# Patient Record
Sex: Female | Born: 1950 | Race: Black or African American | Hispanic: No | Marital: Single | Smoking: Current every day smoker
Health system: Southern US, Community
[De-identification: ages and names within clinical notes are randomized; demographics above are authoritative.]

## PROBLEM LIST (undated history)

## (undated) DIAGNOSIS — J449 Chronic obstructive pulmonary disease, unspecified: Secondary | ICD-10-CM

## (undated) DIAGNOSIS — I509 Heart failure, unspecified: Secondary | ICD-10-CM

## (undated) DIAGNOSIS — I10 Essential (primary) hypertension: Secondary | ICD-10-CM

## (undated) DIAGNOSIS — G8929 Other chronic pain: Secondary | ICD-10-CM

## (undated) DIAGNOSIS — K649 Unspecified hemorrhoids: Secondary | ICD-10-CM

## (undated) DIAGNOSIS — M549 Dorsalgia, unspecified: Secondary | ICD-10-CM

---

## 2006-02-02 ENCOUNTER — Emergency Department (HOSPITAL_COMMUNITY): Admission: EM | Admit: 2006-02-02 | Discharge: 2006-02-03 | Payer: Self-pay | Admitting: Emergency Medicine

## 2006-02-22 ENCOUNTER — Emergency Department (HOSPITAL_COMMUNITY): Admission: EM | Admit: 2006-02-22 | Discharge: 2006-02-22 | Payer: Self-pay | Admitting: Emergency Medicine

## 2006-04-25 ENCOUNTER — Emergency Department (HOSPITAL_COMMUNITY): Admission: EM | Admit: 2006-04-25 | Discharge: 2006-04-25 | Payer: Self-pay | Admitting: Emergency Medicine

## 2009-07-23 ENCOUNTER — Encounter: Admission: RE | Admit: 2009-07-23 | Discharge: 2009-07-23 | Payer: Self-pay | Admitting: Neurological Surgery

## 2009-08-18 ENCOUNTER — Inpatient Hospital Stay (HOSPITAL_COMMUNITY): Admission: RE | Admit: 2009-08-18 | Discharge: 2009-08-21 | Payer: Self-pay | Admitting: Neurological Surgery

## 2009-09-21 ENCOUNTER — Encounter: Admission: RE | Admit: 2009-09-21 | Discharge: 2009-09-21 | Payer: Self-pay | Admitting: Neurological Surgery

## 2009-11-22 ENCOUNTER — Encounter: Admission: RE | Admit: 2009-11-22 | Discharge: 2009-11-22 | Payer: Self-pay | Admitting: Neurological Surgery

## 2009-12-21 ENCOUNTER — Encounter: Admission: RE | Admit: 2009-12-21 | Discharge: 2009-12-21 | Payer: Self-pay | Admitting: Neurological Surgery

## 2010-02-06 ENCOUNTER — Encounter: Payer: Self-pay | Admitting: Neurological Surgery

## 2010-02-21 ENCOUNTER — Other Ambulatory Visit: Payer: Self-pay | Admitting: Neurological Surgery

## 2010-02-21 ENCOUNTER — Ambulatory Visit
Admission: RE | Admit: 2010-02-21 | Discharge: 2010-02-21 | Disposition: A | Discharge status: Home / Self Care | Payer: Medicare Other | Source: Ambulatory Visit | Attending: Neurological Surgery | Admitting: Neurological Surgery

## 2010-02-21 DIAGNOSIS — M545 Low back pain: Secondary | ICD-10-CM

## 2010-04-01 LAB — CARDIAC PANEL(CRET KIN+CKTOT+MB+TROPI): Relative Index: 0.9 (ref 0.0–2.5)

## 2010-04-02 LAB — SURGICAL PCR SCREEN
MRSA, PCR: NEGATIVE
Staphylococcus aureus: NEGATIVE

## 2010-04-02 LAB — DIFFERENTIAL
Basophils Relative: 0 % (ref 0–1)
Eosinophils Absolute: 0.1 10*3/uL (ref 0.0–0.7)
Monocytes Relative: 5 % (ref 3–12)
Neutrophils Relative %: 85 % — ABNORMAL HIGH (ref 43–77)

## 2010-04-02 LAB — CBC
HCT: 38.2 % (ref 36.0–46.0)
Hemoglobin: 12.6 g/dL (ref 12.0–15.0)
WBC: 10 10*3/uL (ref 4.0–10.5)

## 2010-04-02 LAB — URINALYSIS, ROUTINE W REFLEX MICROSCOPIC
Bilirubin Urine: NEGATIVE
Hgb urine dipstick: NEGATIVE
Specific Gravity, Urine: 1.013 (ref 1.005–1.030)
Urobilinogen, UA: 0.2 mg/dL (ref 0.0–1.0)

## 2010-04-02 LAB — COMPREHENSIVE METABOLIC PANEL
ALT: 15 U/L (ref 0–35)
Alkaline Phosphatase: 94 U/L (ref 39–117)
CO2: 30 mEq/L (ref 19–32)
Chloride: 106 mEq/L (ref 96–112)
GFR calc non Af Amer: 45 mL/min — ABNORMAL LOW (ref >60)
Glucose, Bld: 129 mg/dL — ABNORMAL HIGH (ref 70–99)
Potassium: 3.5 mEq/L (ref 3.5–5.1)
Sodium: 143 mEq/L (ref 135–145)

## 2010-04-02 LAB — PROTIME-INR
INR: 1.01 (ref 0.00–1.49)
Prothrombin Time: 13.2 seconds (ref 11.6–15.2)

## 2010-04-02 LAB — TYPE AND SCREEN: Antibody Screen: NEGATIVE

## 2010-05-31 ENCOUNTER — Other Ambulatory Visit: Payer: Self-pay | Admitting: Neurological Surgery

## 2010-05-31 ENCOUNTER — Ambulatory Visit
Admission: RE | Admit: 2010-05-31 | Discharge: 2010-05-31 | Disposition: A | Discharge status: Home / Self Care | Payer: Medicare Other | Source: Ambulatory Visit | Attending: Neurological Surgery | Admitting: Neurological Surgery

## 2010-05-31 DIAGNOSIS — M549 Dorsalgia, unspecified: Secondary | ICD-10-CM

## 2012-03-19 IMAGING — CR DG CHEST 2V
2 series · 2 of 2 positions shown · non-contrast
Comparison: 08/13/2009.

CLINICAL DATA: Stenosis.  Evaluate for infiltrates.

CHEST - 2 VIEW

[w chest pa]
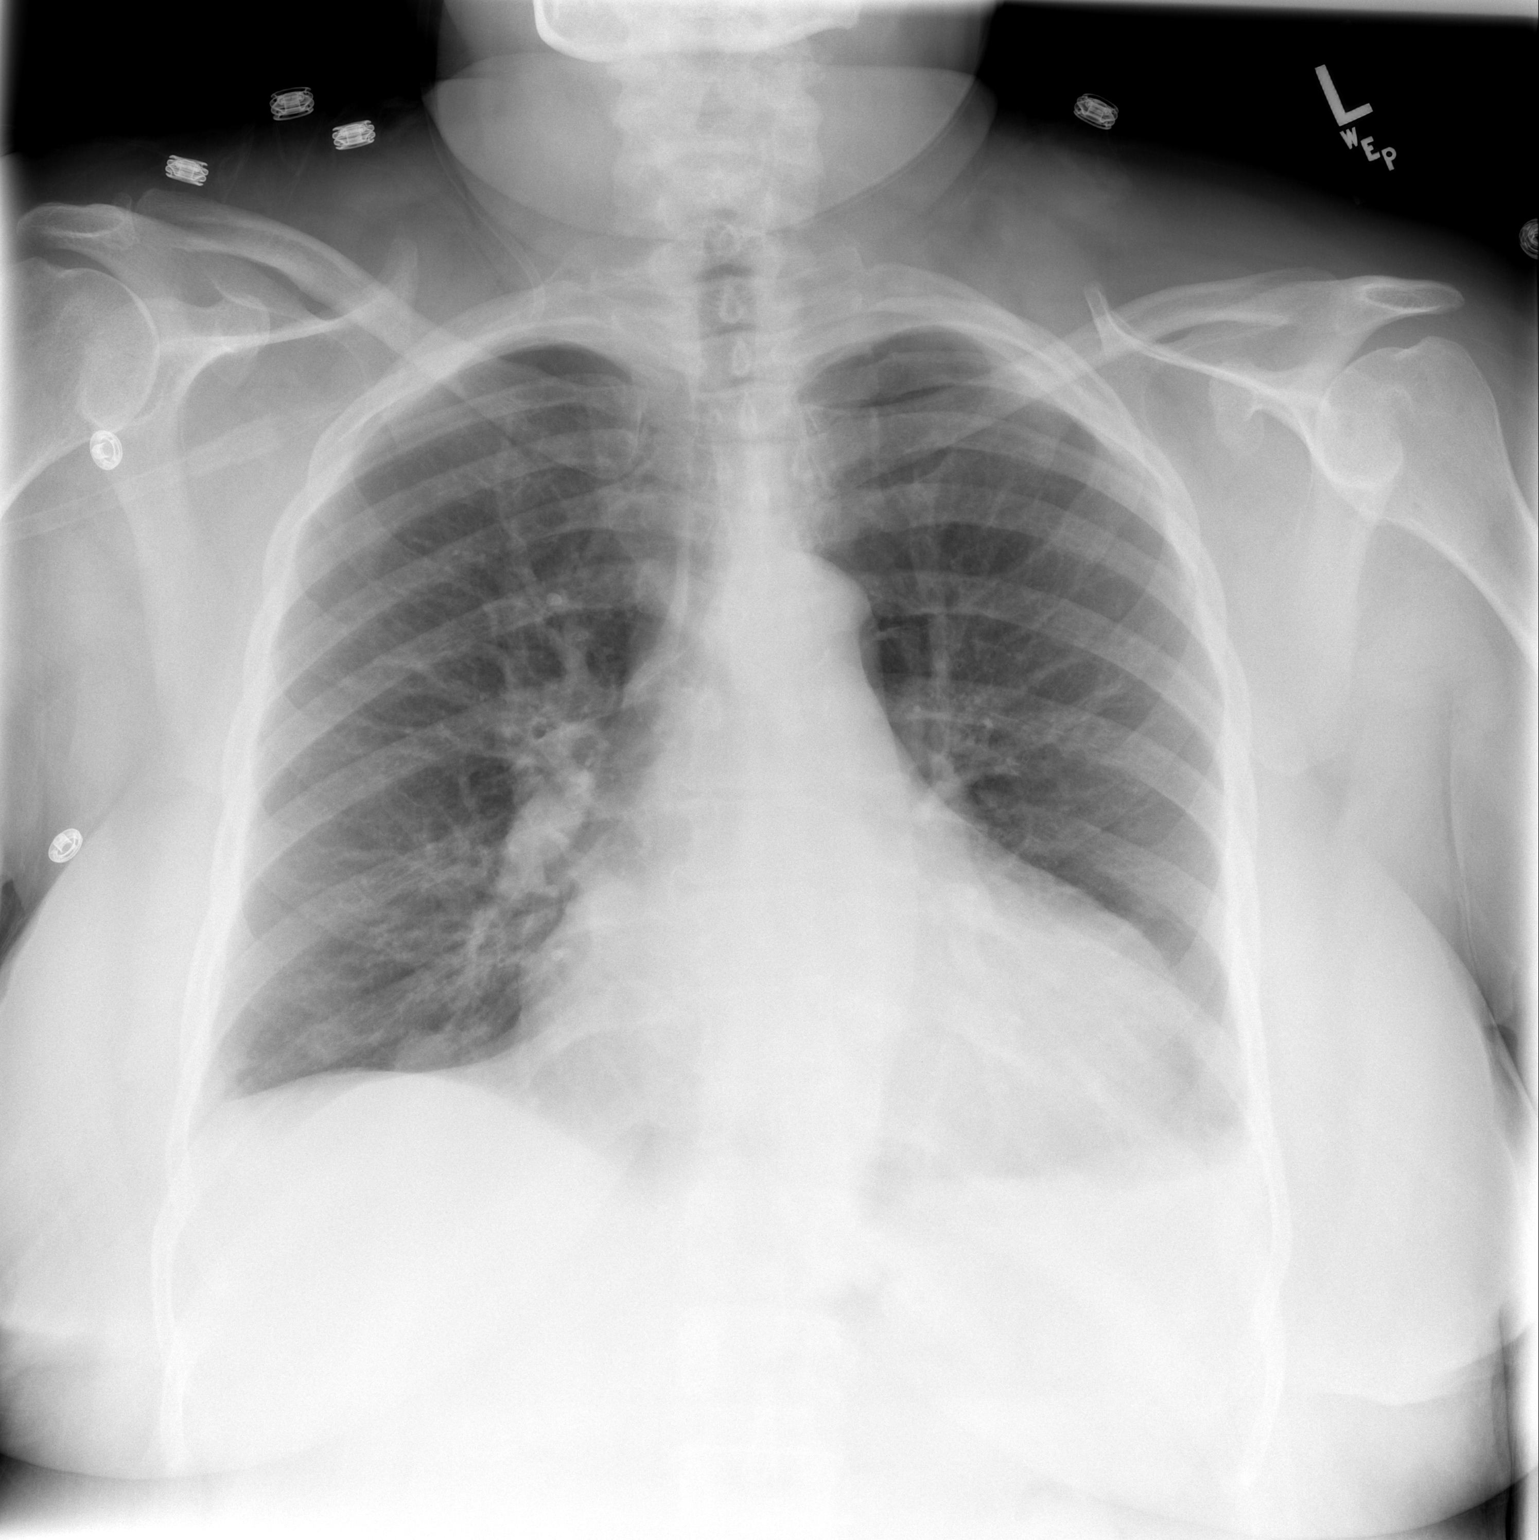

[w chest lat]
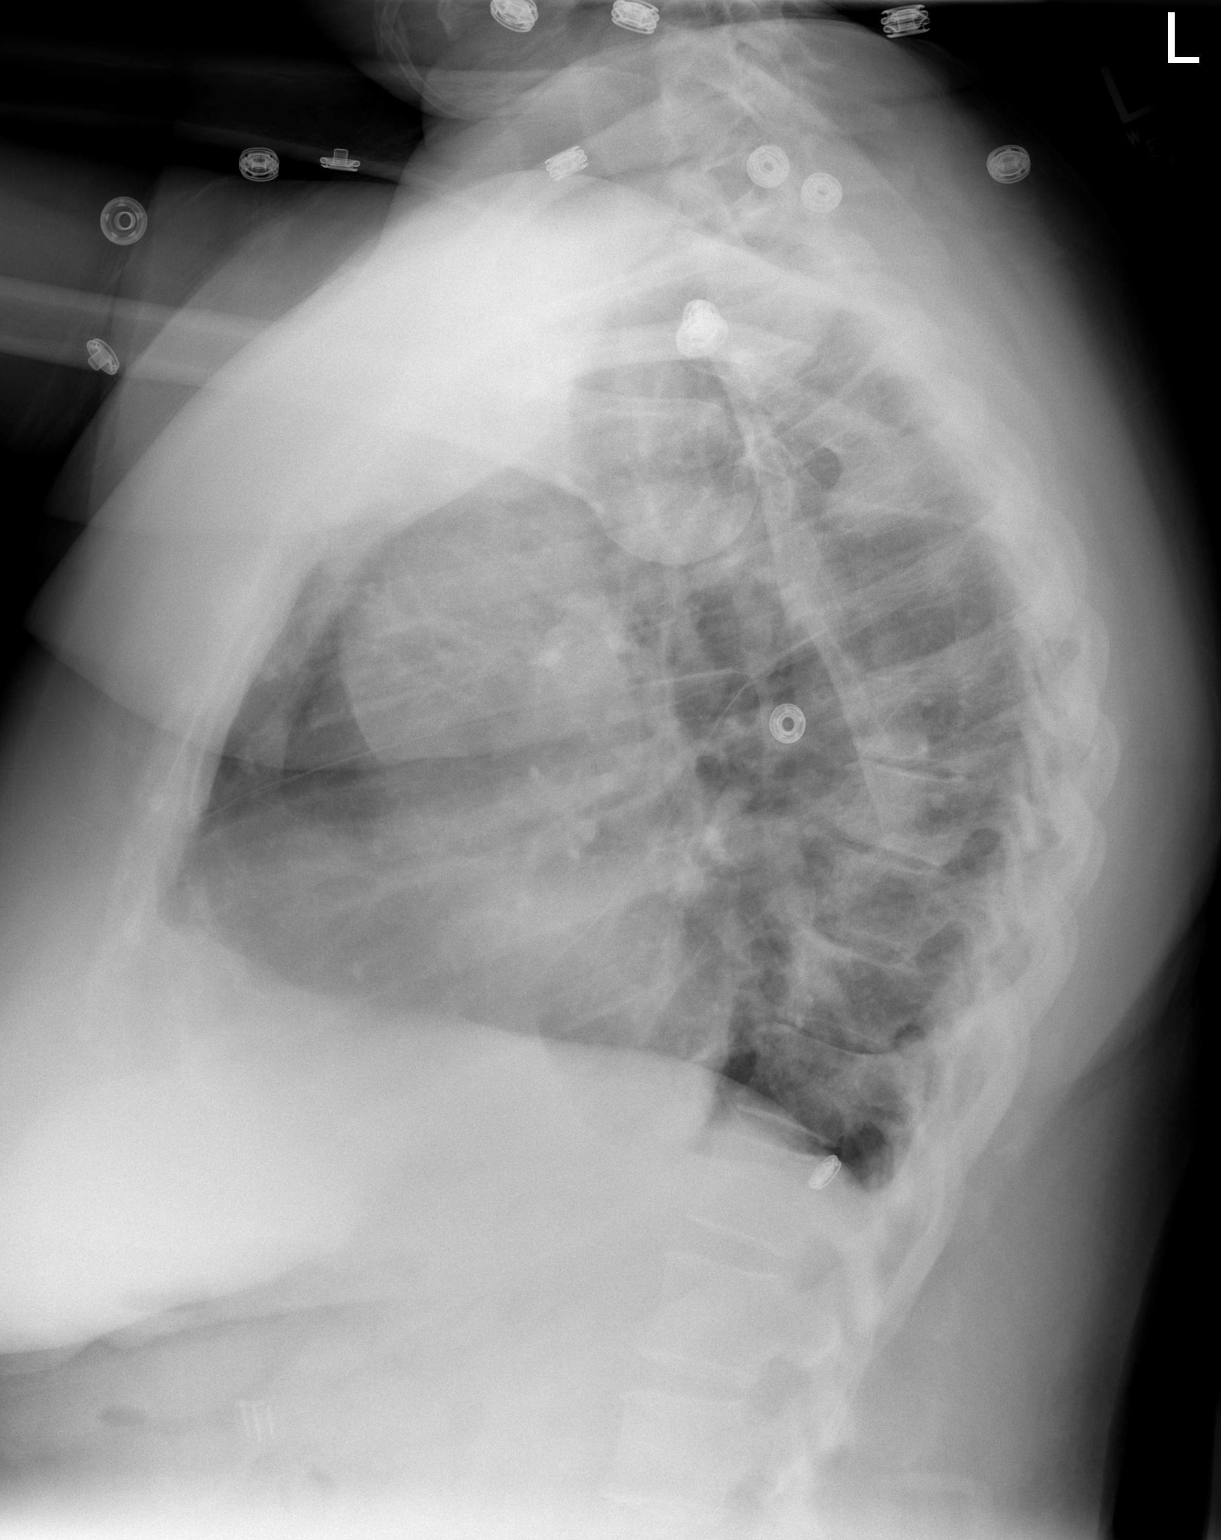

[2 of 2 positions shown; findings below may reference images not displayed]

FINDINGS: Cardiomegaly.  Mild pulmonary vascular congestion.  Small
left pleural effusion.  Mediastinal contours unchanged.
IMPRESSION: Cardiomegaly, pulmonary vascular congestion and small left pleural
effusions suggest mild CHF.

## 2012-06-21 IMAGING — CR DG LUMBAR SPINE 2-3V
3 series · 3 of 3 positions shown · non-contrast
Comparison: The lumbar spine films of 09/21/2009

CLINICAL DATA: Lumbar spine surgery in August 2009, follow-up

LUMBAR SPINE - 2-3 VIEW

[view not recorded (1 of 3)]
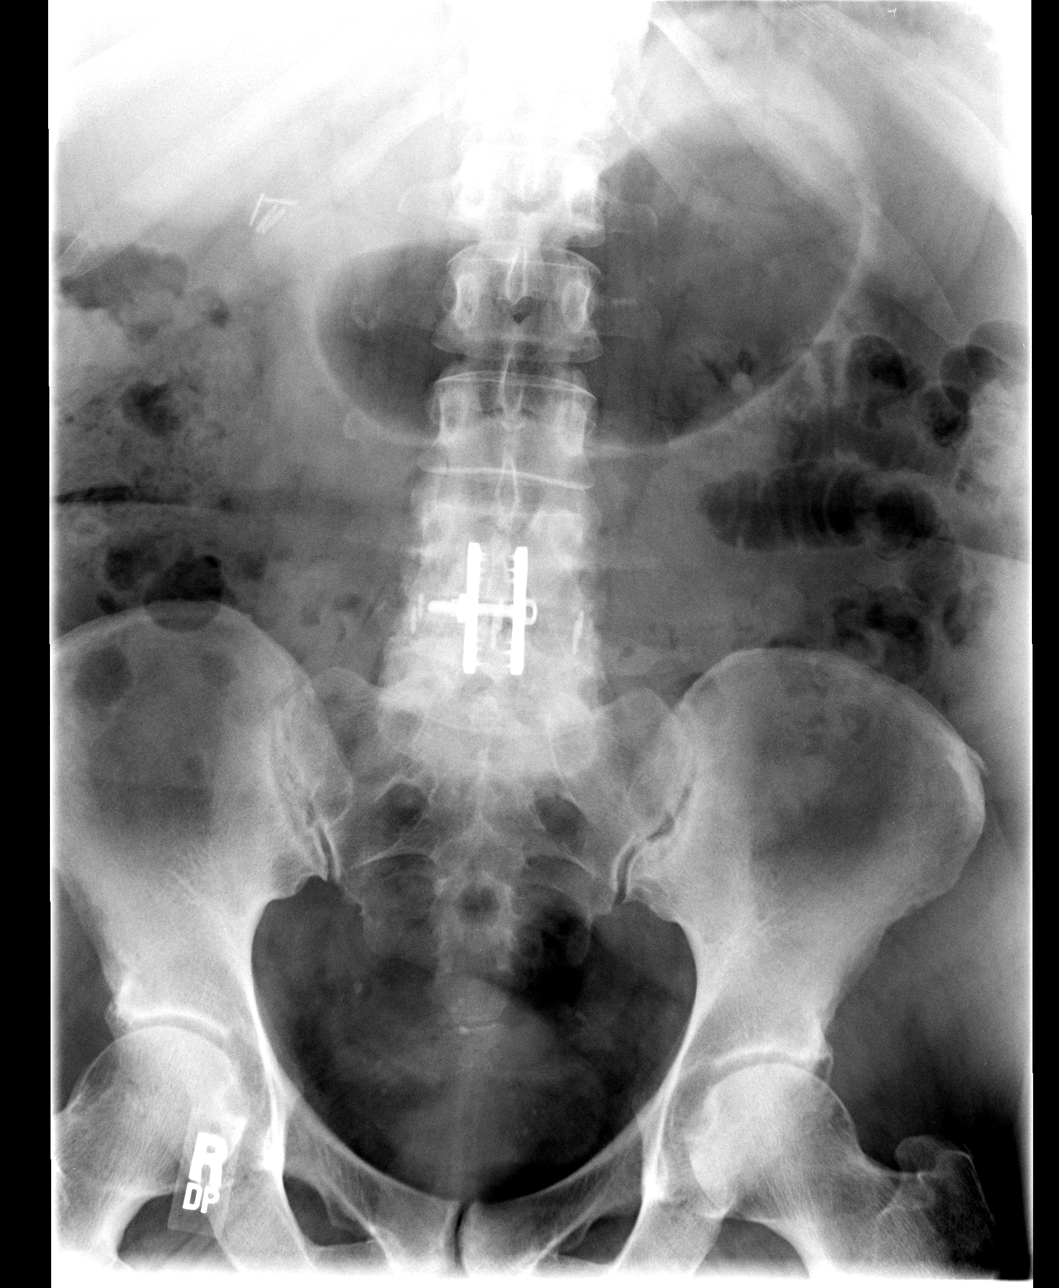

[view not recorded (2 of 3)]
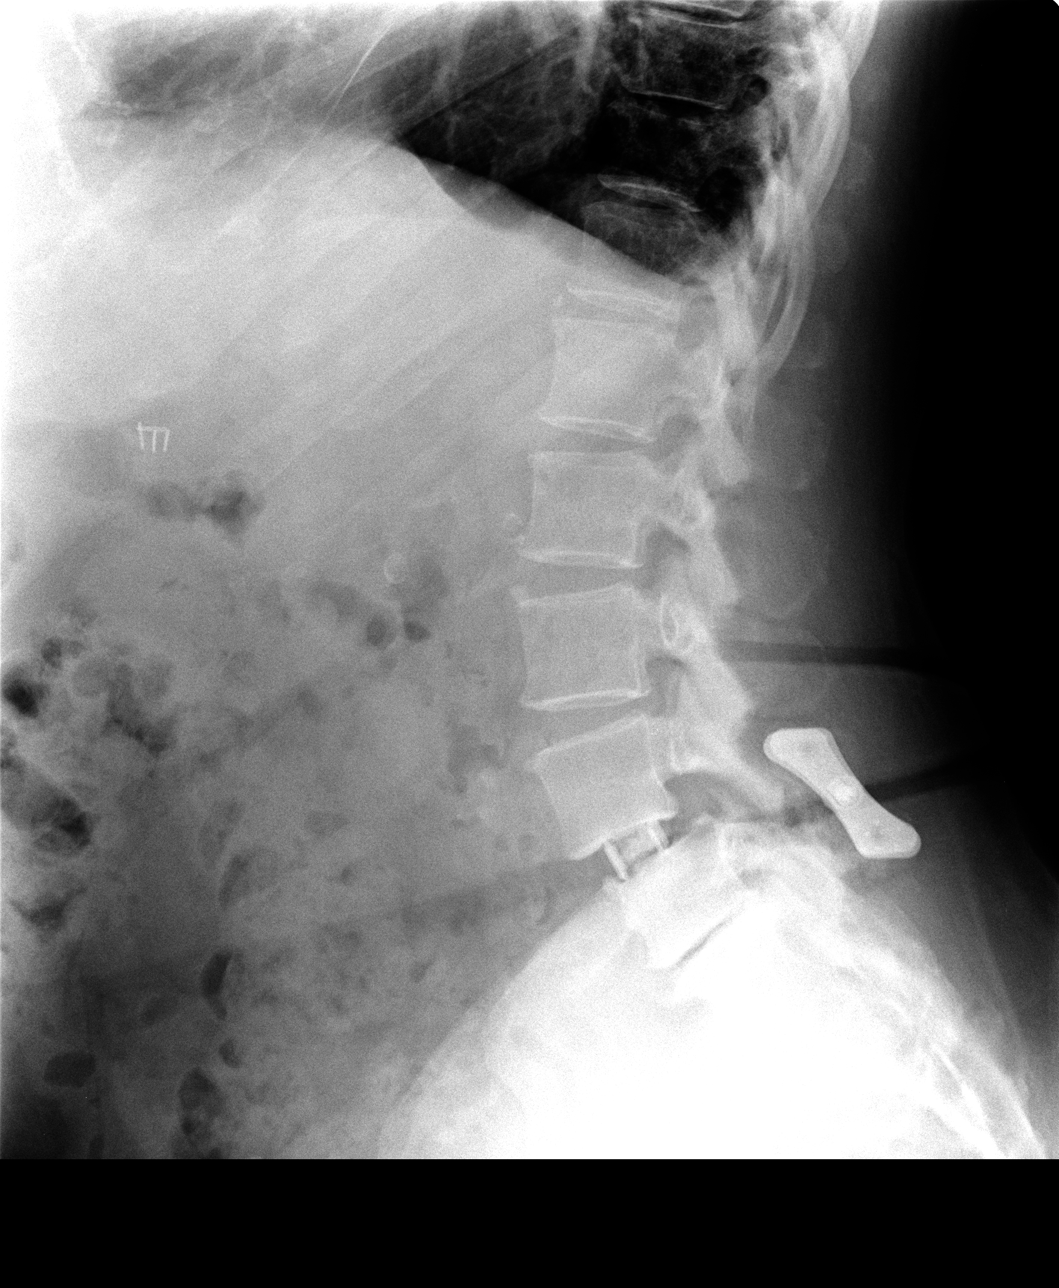

[view not recorded (3 of 3)]
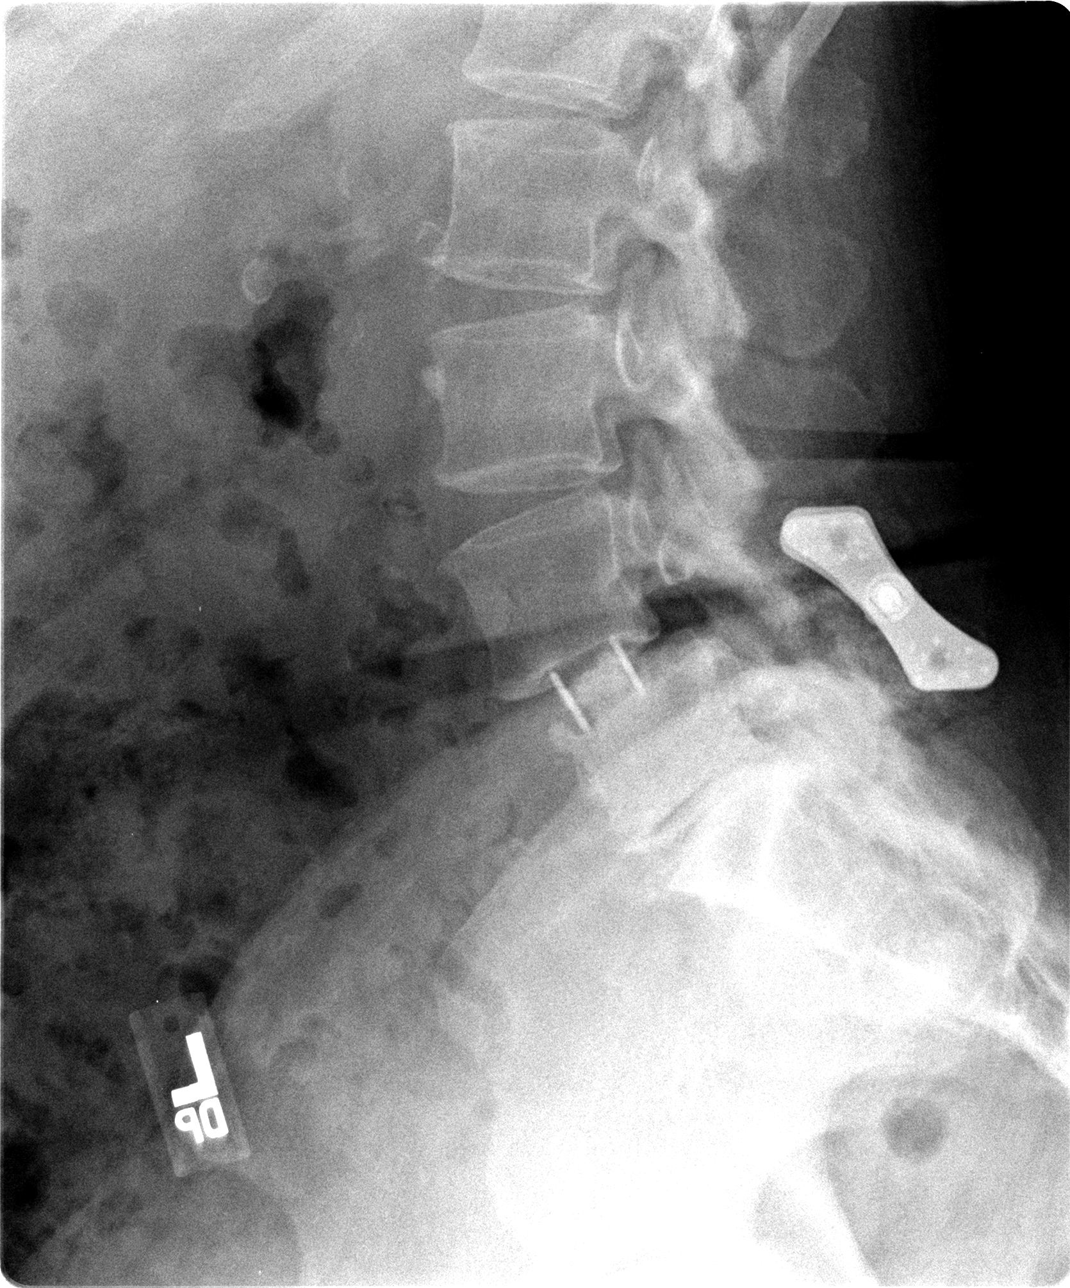

[3 of 3 positions shown; findings below may reference images not displayed]

FINDINGS: The interbody fusion plug at L4-5 is unchanged in
position and in height.  No definite subsidence is seen.  The
interspinous fusion device at L4-5 is stable in position.  There is
no change in slight anterolisthesis of L4 on L5.  Degenerative disc
disease is noted at L5-S1.  Probable left renal calculus again is
noted.
IMPRESSION: Stable fusion at L4-5.  No change in slight anterolisthesis of L4
on L5.

## 2015-01-27 ENCOUNTER — Encounter (HOSPITAL_BASED_OUTPATIENT_CLINIC_OR_DEPARTMENT_OTHER): Payer: Self-pay | Admitting: *Deleted

## 2015-01-27 ENCOUNTER — Emergency Department (HOSPITAL_BASED_OUTPATIENT_CLINIC_OR_DEPARTMENT_OTHER)
Admission: EM | Admit: 2015-01-27 | Discharge: 2015-01-27 | Disposition: A | Discharge status: Home / Self Care | Payer: Medicare HMO | Attending: Emergency Medicine | Admitting: Emergency Medicine

## 2015-01-27 DIAGNOSIS — G8929 Other chronic pain: Secondary | ICD-10-CM | POA: Insufficient documentation

## 2015-01-27 DIAGNOSIS — M25511 Pain in right shoulder: Secondary | ICD-10-CM | POA: Insufficient documentation

## 2015-01-27 DIAGNOSIS — J449 Chronic obstructive pulmonary disease, unspecified: Secondary | ICD-10-CM | POA: Diagnosis not present

## 2015-01-27 DIAGNOSIS — Z79899 Other long term (current) drug therapy: Secondary | ICD-10-CM | POA: Diagnosis not present

## 2015-01-27 DIAGNOSIS — F17210 Nicotine dependence, cigarettes, uncomplicated: Secondary | ICD-10-CM | POA: Diagnosis not present

## 2015-01-27 DIAGNOSIS — I509 Heart failure, unspecified: Secondary | ICD-10-CM | POA: Insufficient documentation

## 2015-01-27 DIAGNOSIS — I10 Essential (primary) hypertension: Secondary | ICD-10-CM | POA: Diagnosis not present

## 2015-01-27 DIAGNOSIS — M62838 Other muscle spasm: Secondary | ICD-10-CM

## 2015-01-27 DIAGNOSIS — M62830 Muscle spasm of back: Secondary | ICD-10-CM | POA: Insufficient documentation

## 2015-01-27 DIAGNOSIS — M542 Cervicalgia: Secondary | ICD-10-CM | POA: Diagnosis present

## 2015-01-27 HISTORY — DX: Heart failure, unspecified: I50.9

## 2015-01-27 HISTORY — DX: Unspecified hemorrhoids: K64.9

## 2015-01-27 HISTORY — DX: Chronic obstructive pulmonary disease, unspecified: J44.9

## 2015-01-27 HISTORY — DX: Essential (primary) hypertension: I10

## 2015-01-27 HISTORY — DX: Other chronic pain: G89.29

## 2015-01-27 HISTORY — DX: Dorsalgia, unspecified: M54.9

## 2015-01-27 MED ORDER — HYDROMORPHONE HCL 2 MG/ML IJ SOLN
2.0000 mg | Freq: Once | INTRAMUSCULAR | Status: AC
  Filled 2015-01-27: qty 1

## 2015-01-27 MED ORDER — NAPROXEN 500 MG PO TABS: 500.0000 mg | ORAL_TABLET | Freq: Two times a day (BID) | ORAL | Status: AC

## 2015-01-27 MED ORDER — CYCLOBENZAPRINE HCL 10 MG PO TABS: 10.0000 mg | ORAL_TABLET | Freq: Two times a day (BID) | ORAL | Status: AC | PRN

## 2015-01-27 MED ORDER — HYDROMORPHONE HCL 1 MG/ML IJ SOLN
INTRAMUSCULAR | Status: AC
  Administered 2015-01-27: 2 mg
  Filled 2015-01-27: qty 2

## 2015-01-27 NOTE — ED Notes (Signed)
Pt awake, assisted to call her son again for update on her pickup. States he is en route.

## 2015-01-27 NOTE — Discharge Instructions (Signed)
Heat Therapy Heat therapy can help ease sore, stiff, injured, and tight muscles and joints. Heat relaxes your muscles, which may help ease your pain. Heat therapy should only be used on old, pre-existing, or long-lasting (chronic) injuries. Do not use heat therapy unless told by your doctor. HOW TO USE HEAT THERAPY There are several different kinds of heat therapy, including:  Moist heat pack.  Warm water bath.  Hot water bottle.  Electric heating pad.  Heated gel pack.  Heated wrap.  Electric heating pad. GENERAL HEAT THERAPY RECOMMENDATIONS   Do not sleep while using heat therapy. Only use heat therapy while you are awake.  Your skin may turn pink while using heat therapy. Do not use heat therapy if your skin turns red.  Do not use heat therapy if you have new pain.  High heat or long exposure to heat can cause burns. Be careful when using heat therapy to avoid burning your skin.  Do not use heat therapy on areas of your skin that are already irritated, such as with a rash or sunburn. GET HELP IF:   You have blisters, redness, swelling (puffiness), or numbness.  You have new pain.  Your pain is worse. MAKE SURE YOU:  Understand these instructions.  Will watch your condition.  Will get help right away if you are not doing well or get worse.   This information is not intended to replace advice given to you by your health care provider. Make sure you discuss any questions you have with your health care provider.   Into needed take her hydrocodone on a regular basis. Start taking the Naprosyn every 12 hours for the next few days. In the Flexeril as a muscle relaxer. Make an appointment to follow-up with your regular doctor. Would recommend applying heat to the right side of your neck. Return for any new or worse symptoms.   Document Released: 03/27/2011 Document Revised: 01/23/2014 Document Reviewed: 02/25/2013 Elsevier Interactive Patient Education [employer]2016 [employer]

## 2015-01-27 NOTE — ED Notes (Addendum)
Pt assisted to dress and to call her son for ride home. Pt states her son will be here in 15 min. Pt states her pain "is easing off now..." pt drifted off to sleep while this rn enters notes at bedside.

## 2015-01-27 NOTE — ED Provider Notes (Signed)
CSN: [id number]     Arrival date & time 01/27/15  [id number] History   First MD Initiated Contact with Patient 01/27/15 [phone]     Chief Complaint  Patient presents with  . Shoulder Pain     (Consider location/radiation/quality/duration/timing/severity/associated sxs/prior Treatment) Patient is a 65 y.o. female presenting with shoulder pain. The history is provided by the patient.  Shoulder Pain Associated symptoms: neck pain   Associated symptoms: no fever    patient with acute right-sided neck pain since 12 noon yesterday when she awoke. Feels like she slept on it wrong. No fall or injury. No numbness or weakness to her right hand or fingers. Hurts to move the neck at all on the right side feels as if a muscle on the side and back of the neck is in spasm. Pain is 10 out of 10. Patient took her hydrocodone at home without any significant relief. Patient has a history of chronic back pain.  Past Medical History  Diagnosis Date  . Hypertension   . CHF (congestive heart failure) (HCC)   . Chronic back pain   . Hemorrhoids   . COPD (chronic obstructive pulmonary disease) (HCC)    History reviewed. No pertinent past surgical history. History reviewed. No pertinent family history. Social History  Substance Use Topics  . Smoking status: Current Every Day Smoker -- 0.50 packs/day    Types: Cigarettes  . Smokeless tobacco: None  . Alcohol Use: None   OB History    No data available     Review of Systems  Constitutional: Negative for fever.  HENT: Negative for congestion.   Eyes: Negative for visual disturbance.  Respiratory: Negative for shortness of breath.   Cardiovascular: Negative for chest pain.  Gastrointestinal: Negative for abdominal pain.  Genitourinary: Negative for dysuria.  Musculoskeletal: Positive for neck pain.  Neurological: Negative for numbness.  Hematological: Does not bruise/bleed easily.  Psychiatric/Behavioral: Negative for confusion.      Allergies  Review  of patient's allergies indicates no known allergies.  Home Medications   Prior to Admission medications   Medication Sig Start Date End Date Taking? Authorizing Provider  AMLODIPINE BESYLATE PO Take by mouth.   Yes Historical Provider, MD  FUROSEMIDE PO Take by mouth.   Yes Historical Provider, MD  LOSARTAN POTASSIUM PO Take by mouth.   Yes Historical Provider, MD  PRAVASTATIN SODIUM PO Take by mouth.   Yes Historical Provider, MD  tiotropium (SPIRIVA) 18 MCG inhalation capsule Place 18 mcg into inhaler and inhale daily.   Yes Historical Provider, MD  cyclobenzaprine (FLEXERIL) 10 MG tablet Take 1 tablet (10 mg total) by mouth 2 (two) times daily as needed for muscle spasms. 01/27/15   Vanetta MuldersScott Charleston Hankin, MD  naproxen (NAPROSYN) 500 MG tablet Take 1 tablet (500 mg total) by mouth 2 (two) times daily. 01/27/15   Vanetta MuldersScott Maymie Brunke, MD   BP 122/82 mmHg  Pulse 64  Temp(Src) 98.7 F (37.1 C) (Oral)  Resp 20  Ht 5\' 7"  (1.702 m)  Wt 95.255 kg  BMI 32.88 kg/m2  SpO2 95% Physical Exam  Constitutional: She is oriented to person, place, and time. She appears well-developed and well-nourished. No distress.  HENT:  Head: Normocephalic and atraumatic.  Mouth/Throat: Oropharynx is clear and moist.  Eyes: Conjunctivae and EOM are normal. Pupils are equal, round, and reactive to light.  Neck:  Tight muscle spasm to the right trapezius muscle. Increased pain with any type of movement on the right side.  Cardiovascular: Normal rate,  regular rhythm and normal heart sounds.   No murmur heard. Pulmonary/Chest: Effort normal and breath sounds normal.  Abdominal: Soft. Bowel sounds are normal. There is no tenderness.  Musculoskeletal: Normal range of motion. She exhibits no tenderness.  No tenderness to the right upper extremity radial pulses 2+ sensation intact good movement of the fingers. No evidence of any neuro focal deficit.  Neurological: She is alert and oriented to person, place, and time. No cranial  nerve deficit. She exhibits normal muscle tone. Coordination normal.  Skin: Skin is warm. No rash noted.  Nursing note and vitals reviewed.   ED Course  Procedures (including critical care time) Labs Review Labs Reviewed - No data to display  Imaging Review No results found. I have personally reviewed and evaluated these images and lab results as part of my medical decision-making.   EKG Interpretation None      MDM   Final diagnoses:  Cervical paraspinal muscle spasm    The patient was spasm of the right-sided trapezius muscle may be a little bit of the sternocleidomastoid muscle. No fall or injury. Awoke with the neck being stiff hurts to move it. Patient taking hydrocodone 10 mg at home without significant relief. We'll have patient continue that. Given a shot of IM hydromorphone here. We'll have the patient start taking Flexeril and a short course of Naprosyn. Follow up with her doctor. Patient without any numbness or pain radiating into the right arm or hand.    Vanetta Mulders, MD 01/27/15 [phone]

## 2015-01-27 NOTE — ED Notes (Signed)
MD at bedside. 

## 2015-01-27 NOTE — ED Notes (Addendum)
Arrived via [facility] with C/o right shoulder and neck pain since yest at 1200 noon. States she thinks she slept wrong. NO injury.

## 2018-04-17 DEATH — deceased
